# Patient Record
Sex: Female | Born: 1975 | Race: White | Hispanic: No | Marital: Single | Smoking: Current every day smoker
Health system: Southern US, Community
[De-identification: ages and names within clinical notes are randomized; demographics above are authoritative.]

---

## 2000-07-27 ENCOUNTER — Other Ambulatory Visit: Admission: RE | Admit: 2000-07-27 | Discharge: 2000-07-27 | Payer: Self-pay | Admitting: Obstetrics

## 2000-11-05 ENCOUNTER — Inpatient Hospital Stay (HOSPITAL_COMMUNITY): Admission: AD | Admit: 2000-11-05 | Discharge: 2000-11-07 | Payer: Self-pay

## 2001-10-28 ENCOUNTER — Inpatient Hospital Stay (HOSPITAL_COMMUNITY): Admission: AD | Admit: 2001-10-28 | Discharge: 2001-10-30 | Payer: Self-pay | Admitting: Obstetrics

## 2001-10-28 ENCOUNTER — Encounter (INDEPENDENT_AMBULATORY_CARE_PROVIDER_SITE_OTHER): Payer: Self-pay

## 2002-03-09 ENCOUNTER — Emergency Department (HOSPITAL_COMMUNITY): Admission: EM | Admit: 2002-03-09 | Discharge: 2002-03-10 | Payer: Self-pay | Admitting: Emergency Medicine

## 2002-07-15 ENCOUNTER — Emergency Department (HOSPITAL_COMMUNITY): Admission: EM | Admit: 2002-07-15 | Discharge: 2002-07-16 | Payer: Self-pay | Admitting: Emergency Medicine

## 2002-07-16 ENCOUNTER — Encounter: Payer: Self-pay | Admitting: Emergency Medicine

## 2002-08-18 ENCOUNTER — Emergency Department (HOSPITAL_COMMUNITY): Admission: EM | Admit: 2002-08-18 | Discharge: 2002-08-18 | Payer: Self-pay | Admitting: Emergency Medicine

## 2003-07-23 ENCOUNTER — Emergency Department (HOSPITAL_COMMUNITY): Admission: EM | Admit: 2003-07-23 | Discharge: 2003-07-24 | Payer: Self-pay | Admitting: Emergency Medicine

## 2003-12-27 ENCOUNTER — Emergency Department (HOSPITAL_COMMUNITY): Admission: EM | Admit: 2003-12-27 | Discharge: 2003-12-27 | Payer: Self-pay | Admitting: Family Medicine

## 2004-10-10 ENCOUNTER — Emergency Department (HOSPITAL_COMMUNITY): Admission: EM | Admit: 2004-10-10 | Discharge: 2004-10-10 | Payer: Self-pay | Admitting: Family Medicine

## 2005-02-03 ENCOUNTER — Emergency Department (HOSPITAL_COMMUNITY): Admission: EM | Admit: 2005-02-03 | Discharge: 2005-02-03 | Payer: Self-pay | Admitting: Family Medicine

## 2006-12-28 ENCOUNTER — Emergency Department (HOSPITAL_COMMUNITY): Admission: EM | Admit: 2006-12-28 | Discharge: 2006-12-28 | Payer: Self-pay | Admitting: Emergency Medicine

## 2008-01-29 IMAGING — CR DG FOOT COMPLETE 3+V*R*
3 series · 3 of 3 positions shown · non-contrast
Comparison: none

CLINICAL DATA: Foot injury.
 RIGHT FOOT ? 3 VIEW:

[view not recorded (1 of 3)]
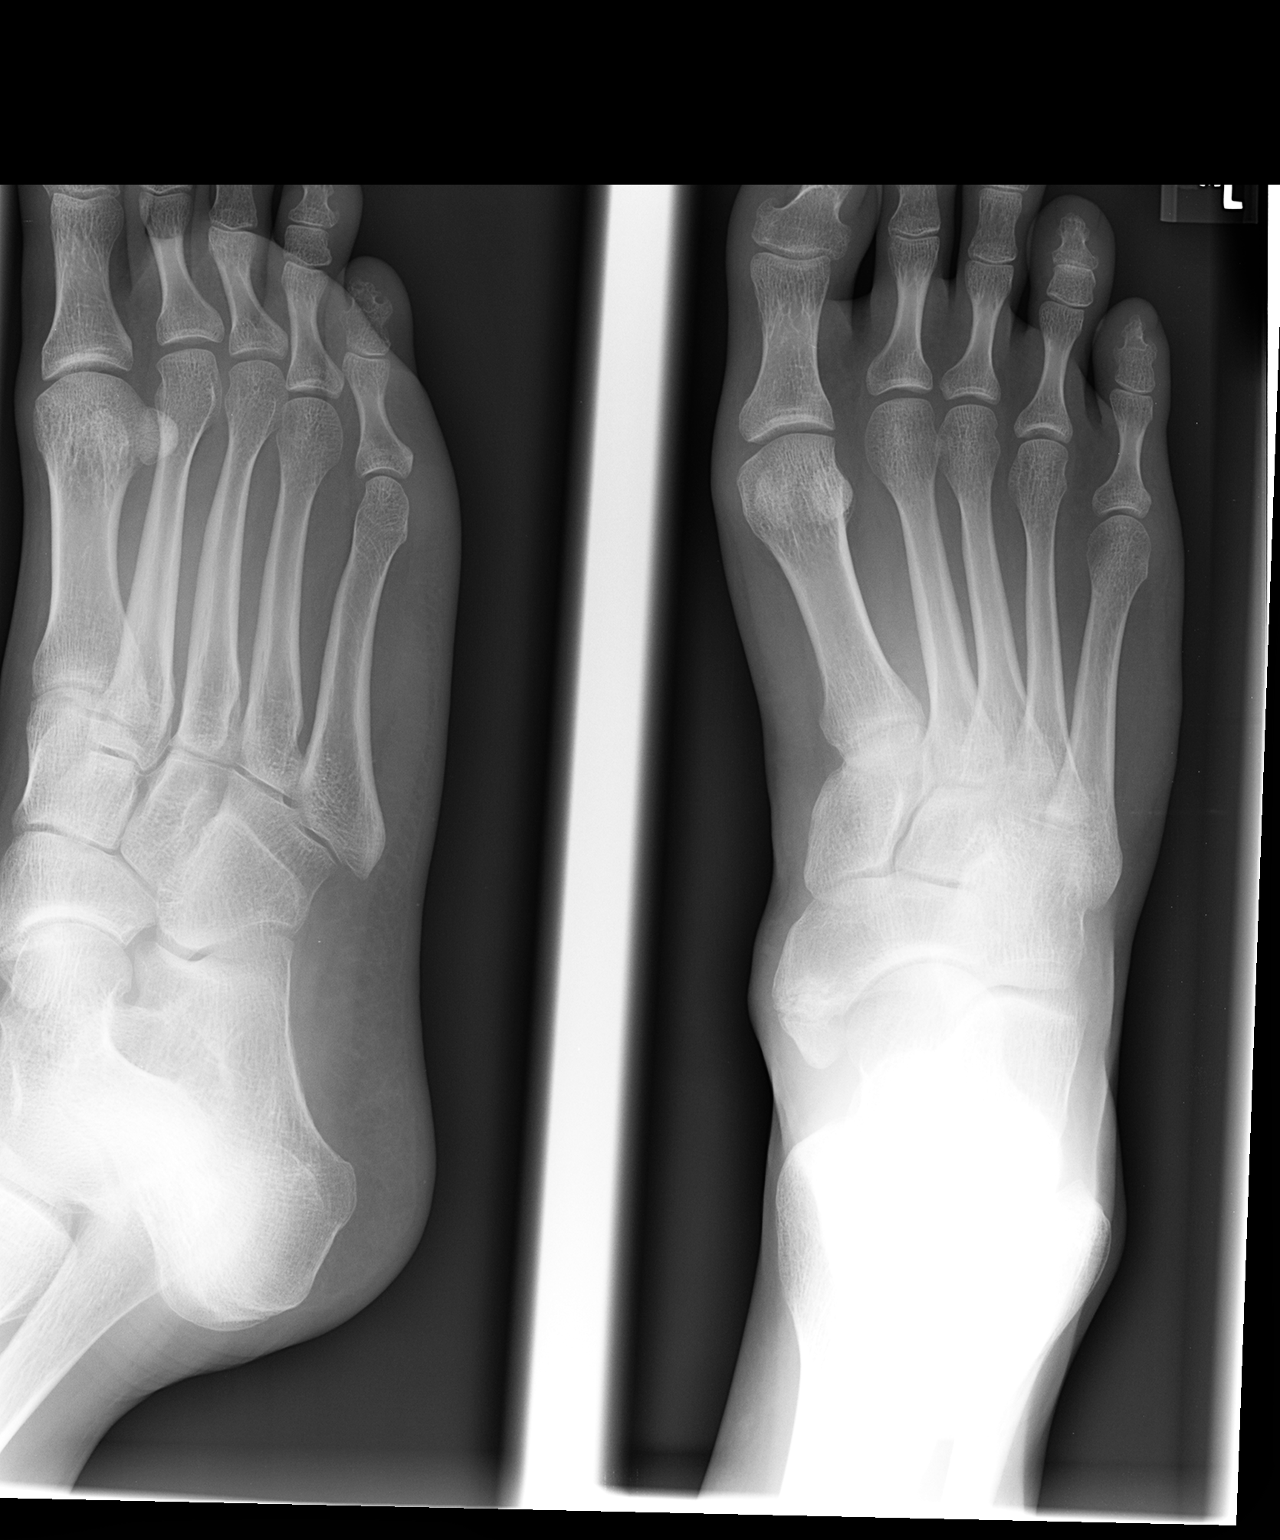

[view not recorded (2 of 3)]
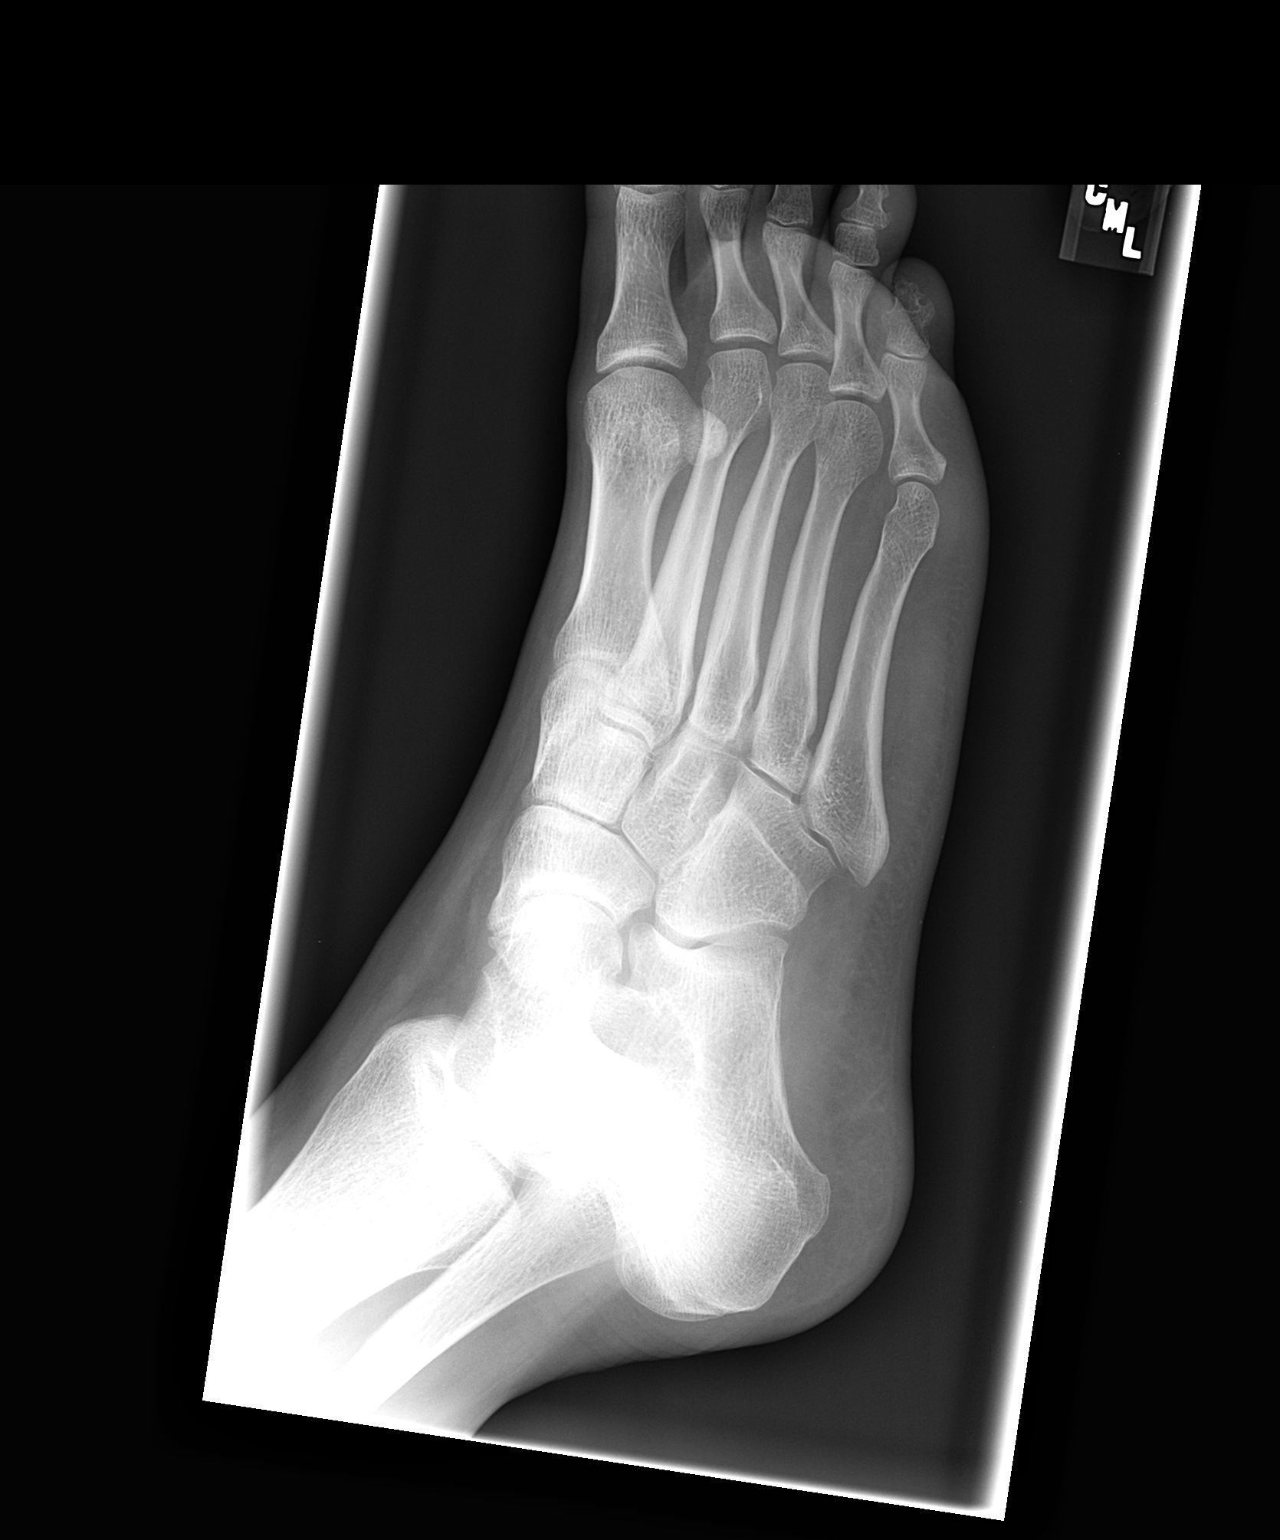

[view not recorded (3 of 3)]
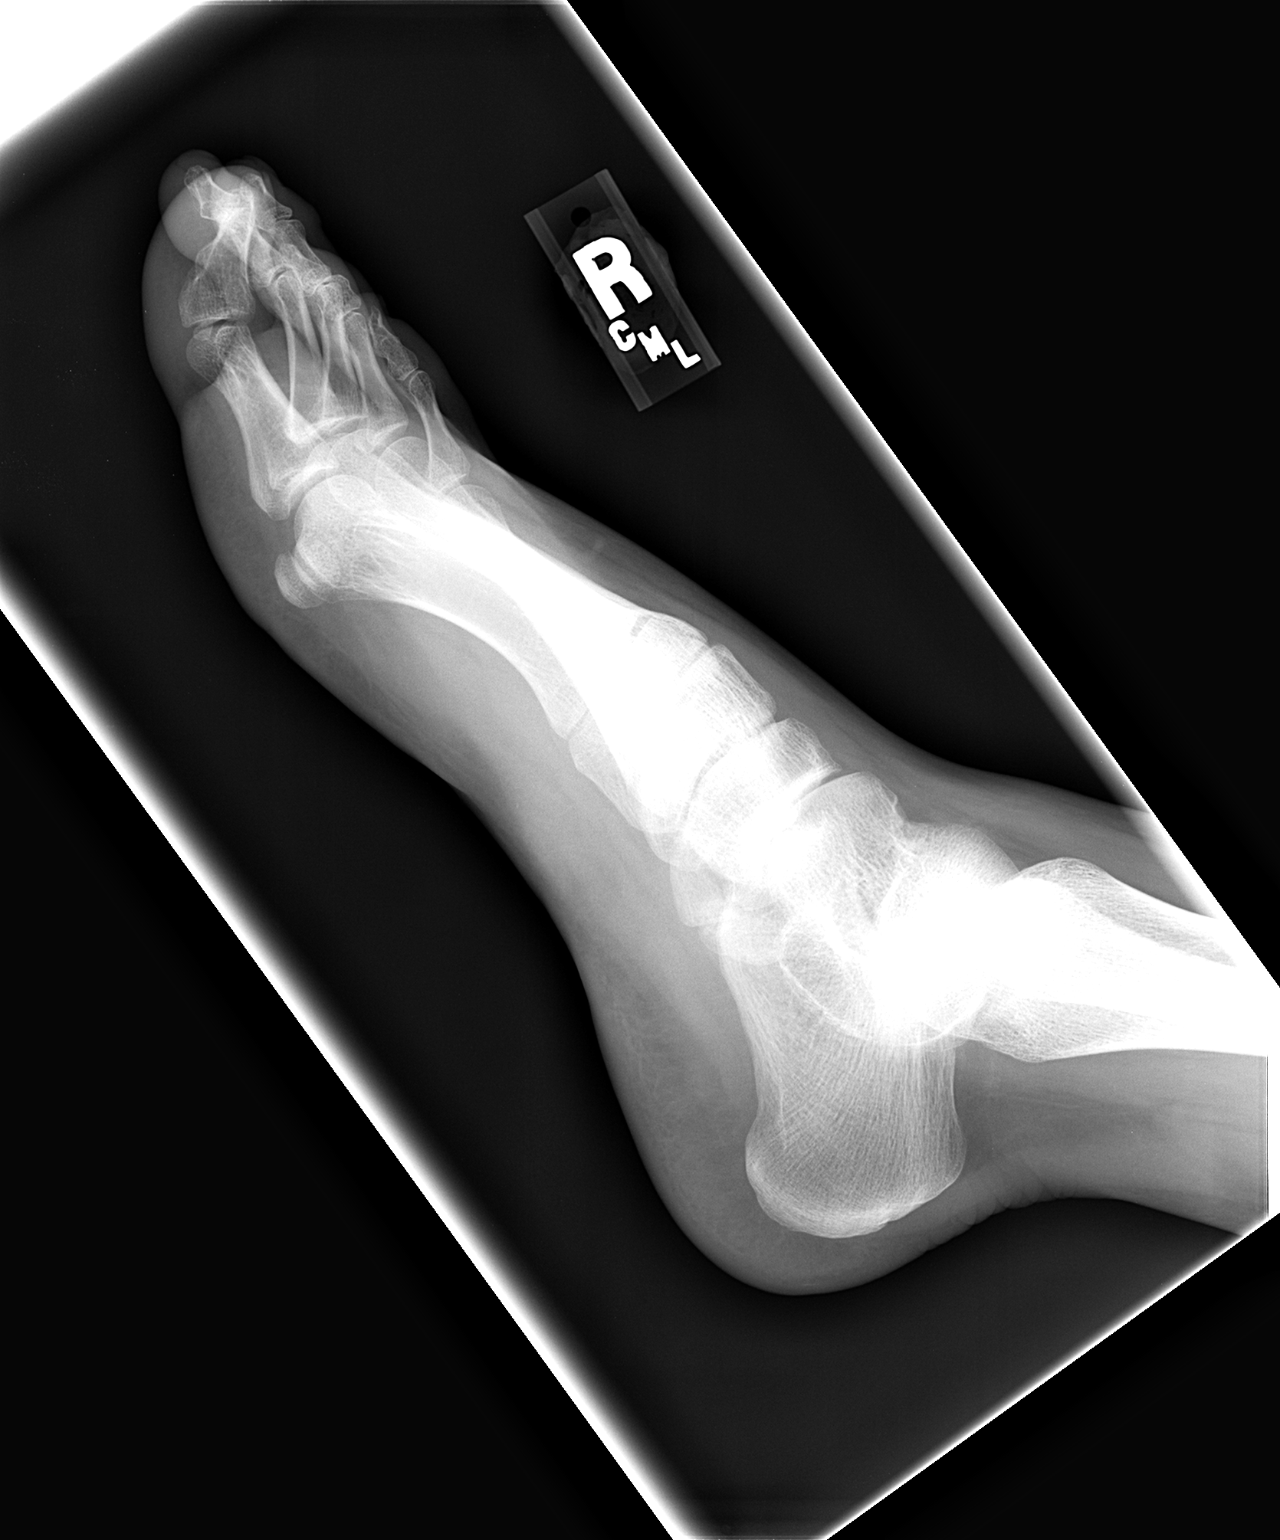

[3 of 3 positions shown; findings below may reference images not displayed]

FINDINGS: There is no evidence of fracture or dislocation.  There is no evidence of arthropathy or other focal bone abnormality.  Soft tissues are unremarkable.
IMPRESSION: Negative.

## 2008-09-14 ENCOUNTER — Emergency Department (HOSPITAL_COMMUNITY): Admission: EM | Admit: 2008-09-14 | Discharge: 2008-09-14 | Payer: Self-pay | Admitting: Family Medicine

## 2009-08-08 ENCOUNTER — Emergency Department (HOSPITAL_COMMUNITY): Admission: EM | Admit: 2009-08-08 | Discharge: 2009-08-09 | Payer: Self-pay | Admitting: Emergency Medicine

## 2010-07-12 ENCOUNTER — Emergency Department (HOSPITAL_COMMUNITY)
Admission: EM | Admit: 2010-07-12 | Discharge: 2010-07-12 | Payer: Self-pay | Source: Home / Self Care | Admitting: Emergency Medicine

## 2010-09-29 LAB — POCT I-STAT, CHEM 8
BUN: 13 mg/dL (ref 6–23)
Calcium, Ion: 1.07 mmol/L — ABNORMAL LOW (ref 1.12–1.32)
Chloride: 108 mEq/L (ref 96–112)
Creatinine, Ser: 0.8 mg/dL (ref 0.4–1.2)
Glucose, Bld: 92 mg/dL (ref 70–99)
HCT: 42 % (ref 36.0–46.0)
Hemoglobin: 14.3 g/dL (ref 12.0–15.0)
Potassium: 3.3 mEq/L — ABNORMAL LOW (ref 3.5–5.1)
Sodium: 138 mEq/L (ref 135–145)
TCO2: 21 mmol/L (ref 0–100)

## 2010-09-29 LAB — RAPID STREP SCREEN (MED CTR MEBANE ONLY): Streptococcus, Group A Screen (Direct): NEGATIVE

## 2010-10-24 LAB — POCT RAPID STREP A (OFFICE): Streptococcus, Group A Screen (Direct): POSITIVE — AB

## 2010-11-29 NOTE — Op Note (Signed)
[facility]  Patient:    Kristen Nolan, Kristen Nolan Visit Number: [id number] MRN: [id number]          Service Type: OBS Location: 910A [id number] Attending Physician:  Venita Sheffield Dictated by:   Kathreen Cosier, M.D. Proc. Date: 10/29/01 Admit Date:  10/28/2001 Discharge Date: 10/30/2001                             Operative Report  PREOPERATIVE DIAGNOSIS:       Multiparity.  PROCEDURE:                    Postpartum tubal ligation.  SURGEON:                      Kathreen Cosier, M.D.  DESCRIPTION OF PROCEDURE:     Using epidural with the patient in the supine position, the abdomen was prepped and draped.  The bladder was emptied with the Foley catheter.  A midline subumbilical incision one inch long was carried down to the rectus fascia.  The fascia was cleaned, grasped with two Kochers and the fascia in the peritoneum opened with the Mayo scissors.  The left tube was grasped in the midportion with a Babcock clamp, and the tube traced to the fimbria.  Sutures of #0 plain were placed in the mesosalpinx below the portion of the tube that had been clamped, and this was tied, and approximately one inch of tube transected.  Hemostasis was satisfactory.  I proceeded in a similar fashion on the other side.  The abdomen was closed in layers.  The peritoneum and the fascia closed with continuous suture of #0 Dexon, and the skin closed with subcuticular stitch of #3-0 plain.  The estimated blood loss was 5 cc.  The patient tolerated the procedure well and was taken to the recovery room in good condition. Dictated by:   Kathreen Cosier, M.D. Attending Physician:  Venita Sheffield DD:  10/29/01 TD:  10/30/01 Job: [id number]

## 2011-08-13 IMAGING — CR DG TOE 3RD 2+V*L*
3 series · 3 of 3 positions shown · non-contrast
Comparison: None

CLINICAL DATA: Toe injury

LEFT TOE - 2+ VIEW

[t toes ap left]
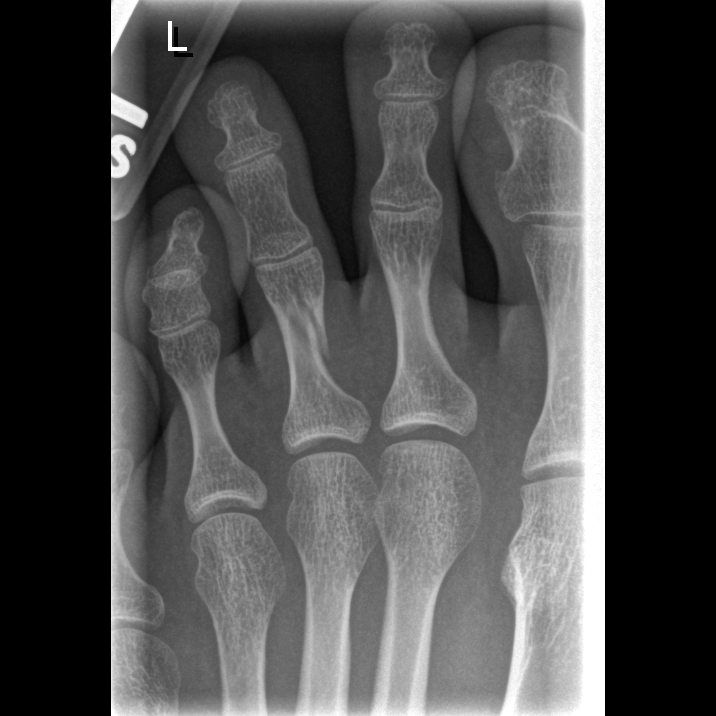

[t toes oblique left]
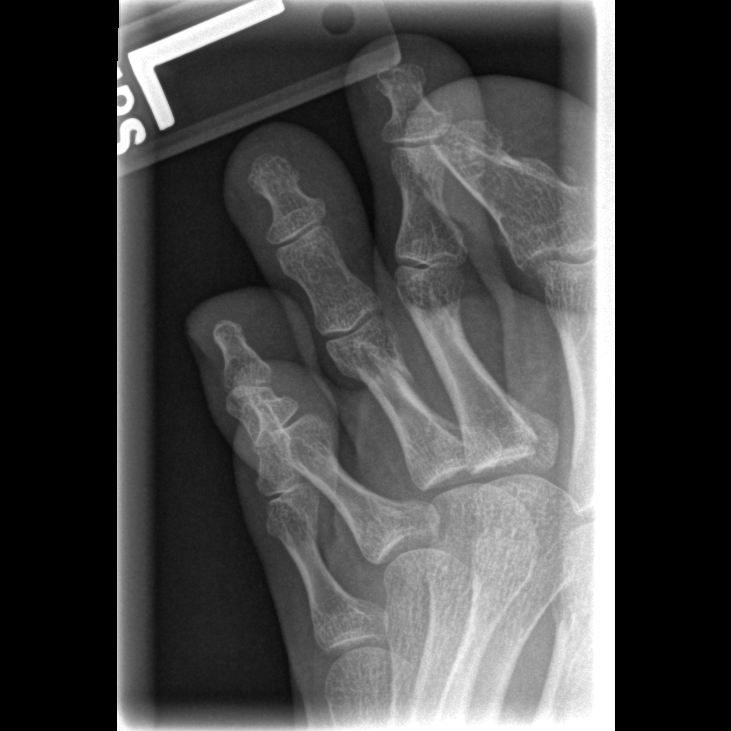

[t toes lateral left]
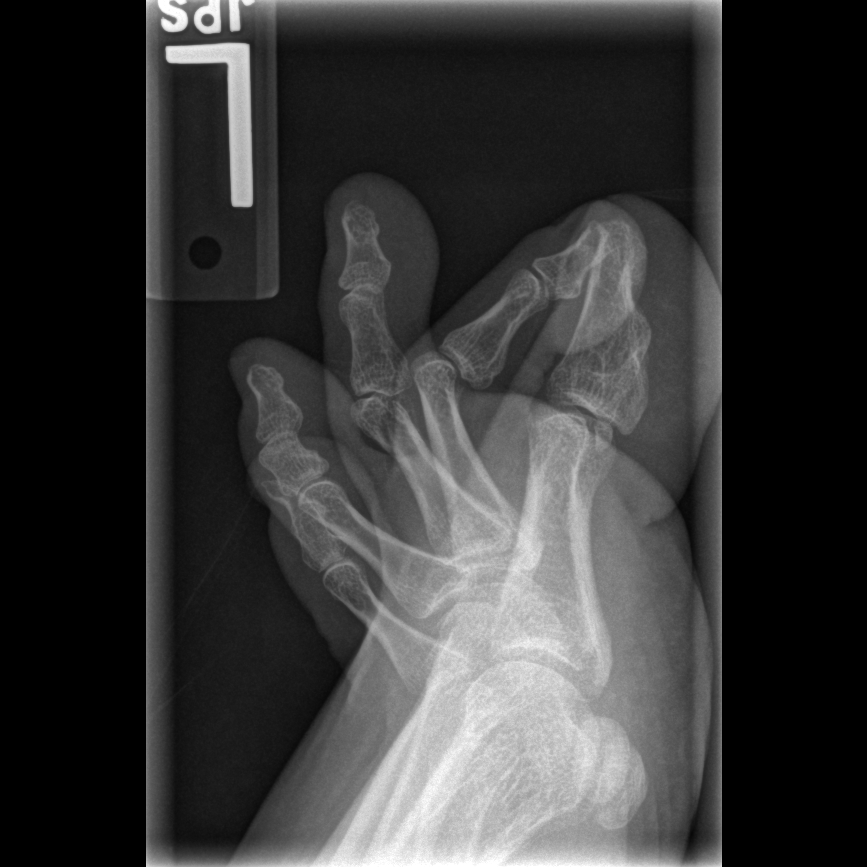

[3 of 3 positions shown; findings below may reference images not displayed]

FINDINGS: Fracture of the proximal third phalanx with mild
displacement.  The fracture may extend into the PIP joint.  No
other fracture.
IMPRESSION: Fracture of the proximal third phalanx which may extend into the
PIP joint.

## 2013-10-01 ENCOUNTER — Encounter (HOSPITAL_COMMUNITY): Payer: Self-pay | Admitting: Emergency Medicine

## 2013-10-01 ENCOUNTER — Emergency Department (HOSPITAL_COMMUNITY)
Admission: EM | Admit: 2013-10-01 | Discharge: 2013-10-01 | Disposition: A | Discharge status: Home / Self Care | Payer: Medicaid Other | Source: Home / Self Care | Attending: Family Medicine | Admitting: Family Medicine

## 2013-10-01 DIAGNOSIS — J329 Chronic sinusitis, unspecified: Secondary | ICD-10-CM

## 2013-10-01 MED ORDER — PREDNISONE 10 MG PO TABS: 30.0000 mg | ORAL_TABLET | Freq: Every day | ORAL | Status: DC

## 2013-10-01 MED ORDER — FLUCONAZOLE 150 MG PO TABS: 150.0000 mg | ORAL_TABLET | Freq: Once | ORAL | Status: DC

## 2013-10-01 MED ORDER — TRAMADOL HCL 50 MG PO TABS: 50.0000 mg | ORAL_TABLET | Freq: Every evening | ORAL | Status: DC | PRN

## 2013-10-01 MED ORDER — IPRATROPIUM BROMIDE 0.06 % NA SOLN: 2.0000 | Freq: Four times a day (QID) | NASAL | Status: DC

## 2013-10-01 MED ORDER — AMOXICILLIN-POT CLAVULANATE 875-125 MG PO TABS: 1.0000 | ORAL_TABLET | Freq: Two times a day (BID) | ORAL | Status: DC

## 2013-10-01 NOTE — ED Notes (Signed)
PT  REPORTS   SYMPTOMS  OF  SINUS      CONGESTION  DRAINAGE        WITH  COUGH  AND  HEADACHE   X  1  WEEK        PT  STES  HAS  HAD  SYMPTOMS  LIKE  THIS  BEFORE  AND  REQUESTS  AUGMENTIN AND  A  YEAST  PILL

## 2013-10-01 NOTE — Discharge Instructions (Signed)
Thank you for coming in today. Take medications as directed Call or go to the emergency room if you get worse, have trouble breathing, have chest pains, or palpitations.   Sinusitis Sinusitis is redness, soreness, and swelling (inflammation) of the paranasal sinuses. Paranasal sinuses are air pockets within the bones of your face (beneath the eyes, the middle of the forehead, or above the eyes). In healthy paranasal sinuses, mucus is able to drain out, and air is able to circulate through them by way of your nose. However, when your paranasal sinuses are inflamed, mucus and air can become trapped. This can allow bacteria and other germs to grow and cause infection. Sinusitis can develop quickly and last only a short time (acute) or continue over a long period (chronic). Sinusitis that lasts for more than 12 weeks is considered chronic.  CAUSES  Causes of sinusitis include:  Allergies.  Structural abnormalities, such as displacement of the cartilage that separates your nostrils (deviated septum), which can decrease the air flow through your nose and sinuses and affect sinus drainage.  Functional abnormalities, such as when the small hairs (cilia) that line your sinuses and help remove mucus do not work properly or are not present. SYMPTOMS  Symptoms of acute and chronic sinusitis are the same. The primary symptoms are pain and pressure around the affected sinuses. Other symptoms include:  Upper toothache.  Earache.  Headache.  Bad breath.  Decreased sense of smell and taste.  A cough, which worsens when you are lying flat.  Fatigue.  Fever.  Thick drainage from your nose, which often is green and may contain pus (purulent).  Swelling and warmth over the affected sinuses. DIAGNOSIS  Your caregiver will perform a physical exam. During the exam, your caregiver may:  Look in your nose for signs of abnormal growths in your nostrils (nasal polyps).  Tap over the affected sinus to  check for signs of infection.  View the inside of your sinuses (endoscopy) with a special imaging device with a light attached (endoscope), which is inserted into your sinuses. If your caregiver suspects that you have chronic sinusitis, one or more of the following tests may be recommended:  Allergy tests.  Nasal culture A sample of mucus is taken from your nose and sent to a lab and screened for bacteria.  Nasal cytology A sample of mucus is taken from your nose and examined by your caregiver to determine if your sinusitis is related to an allergy. TREATMENT  Most cases of acute sinusitis are related to a viral infection and will resolve on their own within 10 days. Sometimes medicines are prescribed to help relieve symptoms (pain medicine, decongestants, nasal steroid sprays, or saline sprays).  However, for sinusitis related to a bacterial infection, your caregiver will prescribe antibiotic medicines. These are medicines that will help kill the bacteria causing the infection.  Rarely, sinusitis is caused by a fungal infection. In theses cases, your caregiver will prescribe antifungal medicine. For some cases of chronic sinusitis, surgery is needed. Generally, these are cases in which sinusitis recurs more than 3 times per year, despite other treatments. HOME CARE INSTRUCTIONS   Drink plenty of water. Water helps thin the mucus so your sinuses can drain more easily.  Use a humidifier.  Inhale steam 3 to 4 times a day (for example, sit in the bathroom with the shower running).  Apply a warm, moist washcloth to your face 3 to 4 times a day, or as directed by your caregiver.  Use saline nasal sprays to help moisten and clean your sinuses.  Take over-the-counter or prescription medicines for pain, discomfort, or fever only as directed by your caregiver. SEEK IMMEDIATE MEDICAL CARE IF:  You have increasing pain or severe headaches.  You have nausea, vomiting, or drowsiness.  You have  swelling around your face.  You have vision problems.  You have a stiff neck.  You have difficulty breathing. MAKE SURE YOU:   Understand these instructions.  Will watch your condition.  Will get help right away if you are not doing well or get worse. Document Released: 06/30/2005 Document Revised: 09/22/2011 Document Reviewed: 07/15/2011 [facility] Patient Information 2014 [employer], [place].

## 2013-10-01 NOTE — ED Provider Notes (Signed)
Kristen ChurchesMelissa J Nolan is a 38 y.o. female who presents to Urgent Care today for sinus pain pressure nasal discharge chest congestion and cough. Patient initially had cold symptoms for one week was improving and then worsened again recently. Symptoms are consistent with prior episodes of bacterial sinusitis. No fevers or chills nausea vomiting or diarrhea. Right facial pain present. Patient feels well otherwise. No shortness of chest pain or palpitations. No sneezing or itchy watery eyes. Patient has tried over-the-counter medications including antihistamines which have not been helpful.   History reviewed. No pertinent past medical history. History  Substance Use Topics  . Smoking status: Never Smoker   . Smokeless tobacco: Not on file  . Alcohol Use: No   ROS as above Medications: No current facility-administered medications for this encounter.   Current Outpatient Prescriptions  Medication Sig Dispense Refill  . amoxicillin-clavulanate (AUGMENTIN) 875-125 MG per tablet Take 1 tablet by mouth every 12 (twelve) hours.  14 tablet  0  . ipratropium (ATROVENT) 0.06 % nasal spray Place 2 sprays into both nostrils 4 (four) times daily.  15 mL  1  . predniSONE (DELTASONE) 10 MG tablet Take 3 tablets (30 mg total) by mouth daily.  15 tablet  0  . traMADol (ULTRAM) 50 MG tablet Take 1 tablet (50 mg total) by mouth at bedtime as needed (cough).  10 tablet  0    Exam:  BP 109/66  Pulse 78  Temp(Src) 98.3 F (36.8 C) (Oral)  Resp 18  SpO2 99%  LMP 10/01/2013 Gen: Well NAD HEENT: EOMI,  MMM tender palpation right maxillary sinus. Her nasal discharge and inflamed nasal turbinates bilaterally. Tympanic membranes normal bilaterally. Posterior pharynx with cobblestoning. Lungs: Normal work of breathing. CTABL Heart: RRR no MRG Abd: NABS, Soft. NT, ND Exts: Brisk capillary refill, warm and well perfused.     Assessment and Plan: 38 y.o. female with sinusitis. Plan to treat with Augmentin,  prednisone, Atrovent nasal spray, and tramadol for cough.  Discussed warning signs or symptoms. Please see discharge instructions. Patient expresses understanding.    Rodolph BongEvan S Statia Burdick, MD 10/01/13 [phone]

## 2017-10-05 ENCOUNTER — Ambulatory Visit (HOSPITAL_COMMUNITY)
Admission: EM | Admit: 2017-10-05 | Discharge: 2017-10-05 | Disposition: A | Discharge status: Home / Self Care | Payer: Self-pay | Attending: Family Medicine | Admitting: Family Medicine

## 2017-10-05 ENCOUNTER — Encounter (HOSPITAL_COMMUNITY): Payer: Self-pay | Admitting: Emergency Medicine

## 2017-10-05 DIAGNOSIS — L600 Ingrowing nail: Secondary | ICD-10-CM

## 2017-10-05 DIAGNOSIS — M79674 Pain in right toe(s): Secondary | ICD-10-CM

## 2017-10-05 MED ORDER — SULFAMETHOXAZOLE-TRIMETHOPRIM 800-160 MG PO TABS: 1.0000 | ORAL_TABLET | Freq: Two times a day (BID) | ORAL | 0 refills | Status: AC

## 2017-10-05 NOTE — ED Provider Notes (Signed)
[facility]    CSN: [id number] Arrival date & time: 10/05/17  1322     History   Chief Complaint Chief Complaint  Patient presents with  . Toe Pain    HPI Kristen Nolan is a 42 y.o. female.   Kristen Nolan presents with complaints of right great toenail pain and swelling. States the nail has had altered growth after stubbing the toe for some time. However starting three days ago developed increased pain and swelling with redness. Has not tried any treatments or taken any medications for symptoms. Without pus or drainage. Without contributing medical history.    ROS per HPI.      History reviewed. No pertinent past medical history.  There are no active problems to display for this patient.   History reviewed. No pertinent surgical history.  OB History   None      Home Medications    Prior to Admission medications   Medication Sig Start Date End Date Taking? Authorizing Provider  sulfamethoxazole-trimethoprim (BACTRIM DS,SEPTRA DS) 800-160 MG tablet Take 1 tablet by mouth 2 (two) times daily for 7 days. 10/05/17 10/12/17  Georgetta Haber, NP    Family History History reviewed. No pertinent family history.  Social History Social History   Tobacco Use  . Smoking status: Never Smoker  Substance Use Topics  . Alcohol use: No  . Drug use: Not on file     Allergies   Patient has no known allergies.   Review of Systems Review of Systems   Physical Exam Triage Vital Signs ED Triage Vitals [10/05/17 1339]  Enc Vitals Group     BP 102/72     Pulse Rate 78     Resp 18     Temp 98.7 F (37.1 C)     Temp Source Oral     SpO2 98 %     Weight      Height      Head Circumference      Peak Flow      Pain Score      Pain Loc      Pain Edu?      Excl. in GC?    No data found.  Updated Vital Signs BP 102/72 (BP Location: Left Arm)   Pulse 78   Temp 98.7 F (37.1 C) (Oral)   Resp 18   SpO2 98%   Visual Acuity Right Eye Distance:     Left Eye Distance:   Bilateral Distance:    Right Eye Near:   Left Eye Near:    Bilateral Near:     Physical Exam  Constitutional: She is oriented to person, place, and time. She appears well-developed and well-nourished. No distress.  Cardiovascular: Normal rate, regular rhythm and normal heart sounds.  Pulmonary/Chest: Effort normal and breath sounds normal.  Musculoskeletal:       Right foot: There is tenderness.  Right great toenail with altered growth and deformity present; growing into distal end of soft tissue rather than lateral toe; mild redness; without active drainage; nail appears to be partially separated from bed   Neurological: She is alert and oriented to person, place, and time.  Skin: Skin is warm and dry.     UC Treatments / Results  Labs (all labs ordered are listed, but only abnormal results are displayed) Labs Reviewed - No data to display  EKG None Radiology No results found.  Procedures Procedures (including critical care time)  Medications Ordered in UC Medications -  No data to display   Initial Impression / Assessment and Plan / UC Course  I have reviewed the triage vital signs and the nursing notes.  Pertinent labs & imaging results that were available during my care of the patient were reviewed by me and considered in my medical decision making (see chart for details).     Significant deformity to nail noted with redness and tenderness. Recommended podiatry for nail removal due to deformity presence. Bactrim initiated and warm soaks. Patient verbalized understanding and agreeable to plan.    Final Clinical Impressions(s) / UC Diagnoses   Final diagnoses:  Ingrown nail of great toe of right foot    ED Discharge Orders        Ordered    sulfamethoxazole-trimethoprim (BACTRIM DS,SEPTRA DS) 800-160 MG tablet  2 times daily     10/05/17 1445       Controlled Substance Prescriptions [place] Controlled Substance Registry consulted? Not  Applicable   Georgetta HaberBurky, Natalie B, NP 10/05/17 1458

## 2017-10-05 NOTE — ED Triage Notes (Signed)
Pt here with right great toe pain from possible ingrown toenail

## 2017-10-05 NOTE — Discharge Instructions (Signed)
Antibiotics.  Warm soaks of foot 2-3 times a day. Please make appointment with Podiatry for definitive nail treatment.

## 2017-10-13 ENCOUNTER — Ambulatory Visit: Payer: Medicaid Other | Admitting: Podiatry

## 2017-10-13 ENCOUNTER — Emergency Department (HOSPITAL_COMMUNITY)
Admission: EM | Admit: 2017-10-13 | Discharge: 2017-10-13 | Disposition: A | Discharge status: Home / Self Care | Payer: Medicaid Other | Attending: Emergency Medicine | Admitting: Emergency Medicine

## 2017-10-13 ENCOUNTER — Encounter (HOSPITAL_COMMUNITY): Payer: Self-pay

## 2017-10-13 DIAGNOSIS — L600 Ingrowing nail: Secondary | ICD-10-CM | POA: Insufficient documentation

## 2017-10-13 DIAGNOSIS — L608 Other nail disorders: Secondary | ICD-10-CM | POA: Insufficient documentation

## 2017-10-13 MED ORDER — LIDOCAINE HCL (PF) 1 % IJ SOLN
30.0000 mL | Freq: Once | INTRAMUSCULAR | Status: AC
Start: 2017-10-13 — End: 2017-10-13
  Administered 2017-10-13: 30 mL
  Filled 2017-10-13: qty 30

## 2017-10-13 MED ORDER — TETANUS-DIPHTH-ACELL PERTUSSIS 5-2.5-18.5 LF-MCG/0.5 IM SUSP
0.5000 mL | Freq: Once | INTRAMUSCULAR | Status: AC
Start: 2017-10-13 — End: 2017-10-13
  Administered 2017-10-13: 0.5 mL via INTRAMUSCULAR
  Filled 2017-10-13: qty 0.5

## 2017-10-13 NOTE — ED Provider Notes (Signed)
[facility]   CSN: [id number] Arrival date & time: 10/13/17  1045     History   Chief Complaint Chief Complaint  Patient presents with  . Toe Pain    HPI Kristen Nolan is a 42 y.o. female.  Kristen Nolan is a 42 y.o. Female who is otherwise healthy, presents to the ED for evaluation of right great toe pain and toenail deformity.  Patient reports she stubbed her toe a year or 2 ago and since then she has had noted deformity of the nail but has not caused her any problems until about a week and a half ago when she started having pain and swelling in the area.  She presented to urgent care 1 week ago for redness, swelling and increasing pain in the right great toe surrounding the nail, was prescribed Bactrim and referred to podiatry.  Patient reports vast improvement in redness, swelling and pain with Bactrim but she is still having pain at the distal end of the toe where the nail seems to be growing into the skin.  Urgent care was not able to remove the nail and refer the patient to podiatry but she reports they wanted $200 before they would even see the patient she is unable to afford this at the time, does not have a primary care provider.  Patient reports she is also been soaking the toe twice a day in Epsom salts.     History reviewed. No pertinent past medical history.  There are no active problems to display for this patient.   History reviewed. No pertinent surgical history.   OB History   None      Home Medications    Prior to Admission medications   Not on File    Family History No family history on file.  Social History Social History   Tobacco Use  . Smoking status: Never Smoker  Substance Use Topics  . Alcohol use: No  . Drug use: Not on file     Allergies   Patient has no known allergies.   Review of Systems Review of Systems  Constitutional: Negative for chills and fever.    Musculoskeletal: Negative for arthralgias, gait problem and joint swelling.  Skin: Negative for color change and rash.       Deformity of right great toenail  Neurological: Negative for weakness and numbness.     Physical Exam Updated Vital Signs BP 112/72 (BP Location: Right Arm)   Pulse 84   Temp 98.1 F (36.7 C) (Oral)   Resp 18   SpO2 98%   Physical Exam  Constitutional: She appears well-developed and well-nourished. No distress.  HENT:  Head: Normocephalic and atraumatic.  Eyes: Right eye exhibits no discharge. Left eye exhibits no discharge.  Pulmonary/Chest: Effort normal. No respiratory distress.  Musculoskeletal:  Right great toenail with altered growth and deformity, nail appears to be growing into the distal end of the soft tissue, and does appear somewhat ingrown on the lateral aspect as well, minimal erythema, tenderness to palpation only at the distal aspect warts growing into the soft tissue, no swelling or drainage present no evident abscess or paronychia, nail appears to be partially separated from the bed  Neurological: She is alert. Coordination normal.  Skin: Skin is warm and dry. She is not diaphoretic.  Psychiatric: She has a normal mood and affect. Her behavior is normal.  Nursing note and vitals reviewed.    ED Treatments /  Results  Labs (all labs ordered are listed, but only abnormal results are displayed) Labs Reviewed - No data to display  EKG None  Radiology No results found.  Procedures .Nail Removal Date/Time: 10/13/2017 11:48 AM Performed by: Dartha Lodge, PA-C Authorized by: Dartha Lodge, PA-C   Consent:    Consent obtained:  Verbal   Consent given by:  Patient   Risks discussed:  Bleeding, incomplete removal, infection and pain   Alternatives discussed:  No treatment Location:    Foot:  R big toe Pre-procedure details:    Skin preparation:  ChloraPrep Anesthesia (see MAR for exact dosages):    Anesthesia method:  Nerve  block   Block location:  R Great toe   Block needle gauge:  25 G   Block anesthetic:  Lidocaine 1% w/o epi   Block technique:  H technique   Block injection procedure:  Incremental injection, introduced needle and anatomic landmarks identified   Block outcome:  Anesthesia achieved Nail Removal:    Nail removed:  Partial   Nail bed repaired: no     Removed nail replaced and anchored: no   Trephination:    Subungual hematoma drained: no   Ingrown nail:    Nail matrix removed or ablated:  None Nails trimmed:    Number of nails trimmed:  1 Post-procedure details:    Dressing:  Antibiotic ointment and 4x4 sterile gauze   Patient tolerance of procedure:  Tolerated well, no immediate complications Comments:     Toenail growing into the distal end of the soft tissue rather than the lateral toenail, toenail itself is extremely thickened, unable to cut or remove the entire nail due to thickness but was able to remove the distal portion that is growing into the soft tissue   (including critical care time)  Medications Ordered in ED Medications  Tdap (BOOSTRIX) injection 0.5 mL (0.5 mLs Intramuscular Given 10/13/17 1119)  lidocaine (PF) (XYLOCAINE) 1 % injection 30 mL (30 mLs Infiltration Given 10/13/17 1120)     Initial Impression / Assessment and Plan / ED Course  I have reviewed the triage vital signs and the nursing notes.  Pertinent labs & imaging results that were available during my care of the patient were reviewed by me and considered in my medical decision making (see chart for details).  Patient presents to the ED for evaluation of deformity of the right great toenail with associated pain, she was seen at urgent care 1 week ago, at that time nail.  Erythematous swollen and tender she was treated with a course of Bactrim and has been soaking the nail in Epsom salts has had improvement with redness, swelling and pain but still has discomfort where the distal end of the nail appears to  be growing into the skin.  Patient referred to podiatry but was unable to afford it at this time.  Nail is very thickened will attempt to remove portion of the nail to relieve pain.  Good anesthesia achieved with digital block of the right great toe.,  Unable to remove entire nail or cut through nail due to thickness, but was able to remove the very distal portion of the nail where it is growing into the skin, patient reports improvement in pain after this.  Bacitracin placed over the distal soft tissue of the nail and gauze and Coban dressing applied.  No erythema or abscess apparent in surrounding skin.  Will have patient complete course of Bactrim and continue with warm soaks.  Discussed with patient that she will still need to follow-up with podiatry for complete nail removal because we just do not have the necessary tools here in the emergency department.  Provided patient with contact information for multiple podiatry offices as well as the [facility].  Discussed signs of infection that would warrant sooner return.  She expresses understanding and is in agreement with plan.  Final Clinical Impressions(s) / ED Diagnoses   Final diagnoses:  Ingrown right big toenail  Nail deformity    ED Discharge Orders    None       Dartha Lodge, [place] 10/13/17 1243    Rolan Bucco, MD 10/13/17 1414

## 2017-10-13 NOTE — Discharge Instructions (Addendum)
Finish course of antibiotics from last week. Use bacitracin ointment and keep toe clean and dry, continue with epsom salt soaks. Ibuprofen and tylenol for pain, although I suspect this will improve now that ingrown portion has been removed. You will still need to follow up with podiatry for complete removal. You can also follow [facility] with [facility] to establish primary care.   Return to emergency department if you have return of signs of infection such as significantly worsening pain, redness, swelling, warmth or drainage of the toe.

## 2017-10-13 NOTE — ED Triage Notes (Signed)
Pt presents for evaluation of R great toe pain. [facility] but [facility] didn't do anything. Pt reports pain with any pressure, unable to wear normal shoes and it is affecting her work.

## 2022-02-27 ENCOUNTER — Ambulatory Visit
Admission: EM | Admit: 2022-02-27 | Discharge: 2022-02-27 | Disposition: A | Discharge status: Home / Self Care | Payer: Managed Care, Other (non HMO) | Attending: Emergency Medicine | Admitting: Emergency Medicine

## 2022-02-27 DIAGNOSIS — B9689 Other specified bacterial agents as the cause of diseases classified elsewhere: Secondary | ICD-10-CM

## 2022-02-27 DIAGNOSIS — J209 Acute bronchitis, unspecified: Secondary | ICD-10-CM

## 2022-02-27 DIAGNOSIS — H66001 Acute suppurative otitis media without spontaneous rupture of ear drum, right ear: Secondary | ICD-10-CM

## 2022-02-27 DIAGNOSIS — F17210 Nicotine dependence, cigarettes, uncomplicated: Secondary | ICD-10-CM

## 2022-02-27 MED ORDER — ALBUTEROL SULFATE HFA 108 (90 BASE) MCG/ACT IN AERS: 2.0000 | INHALATION_SPRAY | Freq: Four times a day (QID) | RESPIRATORY_TRACT | 0 refills | Status: DC | PRN

## 2022-02-27 MED ORDER — PROMETHAZINE-DM 6.25-15 MG/5ML PO SYRP: 5.0000 mL | ORAL_SOLUTION | Freq: Four times a day (QID) | ORAL | 0 refills | Status: DC | PRN

## 2022-02-27 MED ORDER — METHYLPREDNISOLONE 4 MG PO TBPK: ORAL_TABLET | ORAL | 0 refills | Status: DC

## 2022-02-27 MED ORDER — FLUCONAZOLE 150 MG PO TABS: ORAL_TABLET | ORAL | 0 refills | Status: DC

## 2022-02-27 MED ORDER — CEFDINIR 300 MG PO CAPS: 300.0000 mg | ORAL_CAPSULE | Freq: Two times a day (BID) | ORAL | 0 refills | Status: AC

## 2022-02-27 MED ORDER — CETIRIZINE HCL 10 MG PO TABS: 10.0000 mg | ORAL_TABLET | Freq: Every day | ORAL | 1 refills | Status: DC

## 2022-02-27 MED ORDER — IPRATROPIUM BROMIDE 0.06 % NA SOLN: 2.0000 | Freq: Three times a day (TID) | NASAL | 1 refills | Status: DC

## 2022-02-27 NOTE — Discharge Instructions (Addendum)
Your symptoms and physical exam findings are concerning for a viral respiratory infection.  Because you are respiratory allergies are not well controlled at this time, you are more susceptible to catching respiratory infections.  Smoking cigarettes also increases your risk for frequent respiratory infections.  Please see the list below for recommended medications, dosages and frequencies to provide relief of your current symptoms:    Omnicef (cefdinir):  1 capsule twice daily for 10 days, you can take it with or without food.  This antibiotic can cause upset stomach, this will resolve once antibiotics are complete.  You are welcome to use a probiotic, eat yogurt, take Imodium while taking this medication.  Please avoid other systemic medications such as Maalox, Pepto-Bismol or milk of magnesia as they can interfere with your body's ability to absorb the antibiotics.  It is very important that you take antibiotics as prescribed.  If you skip doses or do not complete the full course of antibiotics, you put yourself at significant risk of recurrent infection which can often be worse than your initial infection.   Diflucan (fluconazole): As you may or may not be aware, taking antibiotics can often cause patients to develop a vaginal yeast infection.  For this reason, I have provided you with a prescription for Diflucan.  Please take the first Diflucan tablet on day 3 or 4 of your antibiotic therapy, and take the second Diflucan tablet 3 days later.  You do not need to pick up this prescription or take this medication unless you develop symptoms of vaginal yeast infection including thick, white vaginal discharge and/or vaginal itching.  This prescription has been provided as a Research officer, political party and for your convenience.  Medrol Dosepak (methylprednisolone): This is a steroid that will significantly calm your upper and lower airways, please take one row of tablets daily with your breakfast meal starting tomorrow morning  until the prescription is complete.      Zyrtec (cetirizine): This is an excellent second-generation antihistamine that helps to reduce respiratory inflammatory response to environmental allergens.  In some patients, this medication can cause daytime sleepiness so I recommend that you take 1 tablet daily at bedtime.     Atrovent (ipratropium): This is an excellent nasal decongestant spray that does not cause rebound congestion, please instill 2 sprays into each nare with each use.  Please use the spray up to 4 times daily as needed.  I have provided you with a prescription for this medication.      ProAir, Ventolin, Proventil (albuterol): This inhaled medication contains a short acting beta agonist bronchodilator.  This medication works on the smooth muscle that opens and constricts of your airways by relaxing the muscle.  The result of relaxation of the smooth muscle is increased air movement and improved work of breathing.  This is a short acting medication that can be used every 4-6 hours as needed for increased work of breathing, shortness of breath, wheezing and excessive coughing.  I have provided you with a prescription.    Promethazine DM: Promethazine is both a nasal decongestant and an antinausea medication that makes most patients feel fairly sleepy.  The DM is dextromethorphan, a cough suppressant found in many over-the-counter cough medications.  Please take 5 mL before bedtime to minimize your cough which will help you sleep better.  I have sent a prescription for this medication to your pharmacy.   Please follow-up within the next 5-7 days either with your primary care provider or urgent care if your symptoms  do not resolve.  If you do not have a primary care provider, we will assist you in finding one.   Thank you for visiting urgent care today.  We appreciate the opportunity to participate in your care.

## 2022-02-27 NOTE — ED Triage Notes (Signed)
The pt c/o cough, headache, and congestion that began yesterday.   Home interventions: Nyquil

## 2022-02-27 NOTE — ED Provider Notes (Signed)
[facility]    CSN: [id number] Arrival date & time: 02/27/22  1059    HISTORY   Chief Complaint  Patient presents with   Cough   Headache   Nasal Congestion   HPI Kristen Nolan is a pleasant, 46 y.o. female who presents to urgent care today. The pt c/o cough, sinus headache, and nasal congestion with rhinorrhea that began yesterday. Home interventions: Nyquil last night, states she was able to sleep but woke up and felt worse than she did yesterday.  Patient states she is a current everyday smoker.  Patient states she does not have a history of allergies or asthma.  Patient states the cough is nonproductive but it feels like it needs to be.  Patient denies otalgia, body aches, chills, known sick contacts, fever, nausea, vomiting, diarrhea.  The history is provided by the patient.   History reviewed. No pertinent past medical history. There are no problems to display for this patient.  History reviewed. No pertinent surgical history. OB History   No obstetric history on file.    Home Medications    Prior to Admission medications   Not on File    Family History History reviewed. No pertinent family history. Social History Social History   Tobacco Use   Smoking status: Some Days    Types: Cigarettes  Vaping Use   Vaping Use: Never used  Substance Use Topics   Alcohol use: No   Drug use: Never   Allergies   Patient has no known allergies.  Review of Systems Review of Systems Pertinent findings revealed after performing a 14 point review of systems has been noted in the history of present illness.  Physical Exam Triage Vital Signs ED Triage Vitals  Enc Vitals Group     BP 05/10/21 0827 (!) 147/82     Pulse Rate 05/10/21 0827 72     Resp 05/10/21 0827 18     Temp 05/10/21 0827 98.3 F (36.8 C)     Temp Source 05/10/21 0827 Oral     SpO2 05/10/21 0827 98 %     Weight --      Height --      Head Circumference --      Peak Flow --       Pain Score 05/10/21 0826 5     Pain Loc --      Pain Edu? --      Excl. in GC? --   No data found.  Updated Vital Signs BP 126/81 (BP Location: Right Arm)   Pulse 72   Temp 98.7 F (37.1 C) (Oral)   Resp 18   LMP 02/22/2022   SpO2 98%   Physical Exam Vitals and nursing note reviewed.  Constitutional:      General: She is not in acute distress.    Appearance: Normal appearance. She is not ill-appearing.  HENT:     Head: Normocephalic and atraumatic.     Salivary Glands: Right salivary gland is not diffusely enlarged or tender. Left salivary gland is not diffusely enlarged or tender.     Right Ear: Hearing and external ear normal. No drainage. No middle ear effusion. There is no impacted cerumen. Tympanic membrane is injected, erythematous and retracted. Tympanic membrane is not bulging.     Left Ear: Hearing, tympanic membrane, ear canal and external ear normal. No drainage.  No middle ear effusion. There is no impacted cerumen. Tympanic membrane is not erythematous or bulging.  Ears:     Comments: Right EAC with erythema    Nose: Mucosal edema, congestion and rhinorrhea present. No nasal deformity or septal deviation. Rhinorrhea is clear.     Right Turbinates: Not enlarged, swollen or pale.     Left Turbinates: Not enlarged, swollen or pale.     Right Sinus: Maxillary sinus tenderness and frontal sinus tenderness present.     Left Sinus: Maxillary sinus tenderness and frontal sinus tenderness present.     Mouth/Throat:     Lips: Pink. No lesions.     Mouth: Mucous membranes are moist. No oral lesions.     Tongue: No lesions.     Palate: No mass.     Pharynx: Oropharynx is clear. Uvula midline. No pharyngeal swelling, oropharyngeal exudate, posterior oropharyngeal erythema or uvula swelling.     Tonsils: No tonsillar exudate. 0 on the right. 0 on the left.  Eyes:     General: Lids are normal.        Right eye: No discharge.        Left eye: No discharge.     Extraocular  Movements: Extraocular movements intact.     Conjunctiva/sclera: Conjunctivae normal.     Right eye: Right conjunctiva is not injected.     Left eye: Left conjunctiva is not injected.  Neck:     Trachea: Trachea and phonation normal.  Cardiovascular:     Rate and Rhythm: Normal rate and regular rhythm.     Pulses: Normal pulses.     Heart sounds: Normal heart sounds. No murmur heard.    No friction rub. No gallop.  Pulmonary:     Effort: Pulmonary effort is normal. No tachypnea, bradypnea, accessory muscle usage, prolonged expiration, respiratory distress or retractions.     Breath sounds: Decreased air movement present. No stridor or transmitted upper airway sounds. Examination of the right-upper field reveals wheezing. Examination of the left-upper field reveals wheezing. Examination of the right-middle field reveals wheezing. Examination of the left-middle field reveals wheezing. Wheezing present. No decreased breath sounds, rhonchi or rales.  Chest:     Chest wall: No tenderness.  Musculoskeletal:        General: Normal range of motion.     Cervical back: Normal range of motion and neck supple. Normal range of motion.  Lymphadenopathy:     Cervical: Cervical adenopathy present.     Right cervical: Superficial cervical adenopathy and posterior cervical adenopathy present.     Left cervical: Superficial cervical adenopathy and posterior cervical adenopathy present.  Skin:    General: Skin is warm and dry.     Findings: No erythema or rash.  Neurological:     General: No focal deficit present.     Mental Status: She is alert and oriented to person, place, and time.  Psychiatric:        Mood and Affect: Mood normal.        Behavior: Behavior normal.     Visual Acuity Right Eye Distance:   Left Eye Distance:   Bilateral Distance:    Right Eye Near:   Left Eye Near:    Bilateral Near:     UC Couse / Diagnostics / Procedures:     Radiology No results  found.  Procedures Procedures (including critical care time) EKG  Pending results:  Labs Reviewed - No data to display  Medications Ordered in UC: Medications - No data to display  UC Diagnoses / Final Clinical Impressions(s)   I have reviewed  the triage vital signs and the nursing notes.  Pertinent labs & imaging results that were available during my care of the patient were reviewed by me and considered in my medical decision making (see chart for details).    Final diagnoses:  Acute suppurative otitis media of right ear without spontaneous rupture of tympanic membrane, recurrence not specified  Acute bacterial sinusitis  Acute bronchitis, unspecified organism  Tobacco dependence due to cigarettes   Patient prescribed Omnicef for acute otitis media likely bacterial sinusitis.  Patient provided with Diflucan for inevitable vaginal yeast infection caused by 10 days of taking antibiotics.  Patient provided with albuterol and Medrol Dosepak for wheezing on exam.  Promethazine DM for nighttime cough.  Patient educated regarding risk of smoking cigarettes and increased respiratory infections.  Patient provided with allergy treatment.  ED Prescriptions     Medication Sig Dispense Auth. Provider   ipratropium (ATROVENT) 0.06 % nasal spray Place 2 sprays into both nostrils 3 (three) times daily. As needed for nasal congestion, runny nose 15 mL Theadora Rama Scales, PA-C   cetirizine (ZYRTEC ALLERGY) 10 MG tablet Take 1 tablet (10 mg total) by mouth at bedtime. 90 tablet Theadora Rama Scales, PA-C   cefdinir (OMNICEF) 300 MG capsule Take 1 capsule (300 mg total) by mouth 2 (two) times daily for 10 days. 20 capsule Theadora Rama Scales, PA-C   albuterol (VENTOLIN HFA) 108 (90 Base) MCG/ACT inhaler Inhale 2 puffs into the lungs every 6 (six) hours as needed for wheezing or shortness of breath (Cough). 18 g Theadora Rama Scales, PA-C   methylPREDNISolone (MEDROL DOSEPAK) 4 MG TBPK  tablet Take 24 mg on day 1, 20 mg on day 2, 16 mg on day 3, 12 mg on day 4, 8 mg on day 5, 4 mg on day 6.  Take all tablets in each row at once, do not spread tablets out throughout the day. 21 tablet Theadora Rama Scales, PA-C   promethazine-dextromethorphan (PROMETHAZINE-DM) 6.25-15 MG/5ML syrup Take 5 mLs by mouth 4 (four) times daily as needed for cough. 118 mL Theadora Rama Scales, PA-C   fluconazole (DIFLUCAN) 150 MG tablet Take 1 tablet on day 4 of antibiotics.  Take second tablet 3 days later. 2 tablet Theadora Rama Scales, PA-C      PDMP not reviewed this encounter.  Disposition Upon Discharge:  Condition: stable for discharge home Home: take medications as prescribed; routine discharge instructions as discussed; follow up as advised.  Patient presented with an acute illness with associated systemic symptoms and significant discomfort requiring urgent management. In my opinion, this is a condition that a prudent lay person (someone who possesses an average knowledge of health and medicine) may potentially expect to result in complications if not addressed urgently such as respiratory distress, impairment of bodily function or dysfunction of bodily organs.   Routine symptom specific, illness specific and/or disease specific instructions were discussed with the patient and/or caregiver at length.   As such, the patient has been evaluated and assessed, work-up was performed and treatment was provided in alignment with urgent care protocols and evidence based medicine.  Patient/parent/caregiver has been advised that the patient may require follow up for further testing and treatment if the symptoms continue in spite of treatment, as clinically indicated and appropriate.  If the patient was tested for COVID-19, Influenza and/or RSV, then the patient/parent/guardian was advised to isolate at home pending the results of his/her diagnostic coronavirus test and potentially longer if they're  positive. I have  also advised pt that if his/her COVID-19 test returns positive, it's recommended to self-isolate for at least 10 days after symptoms first appeared AND until fever-free for 24 hours without fever reducer AND other symptoms have improved or resolved. Discussed self-isolation recommendations as well as instructions for household member/close contacts as per the [facility] and [place] DHHS, and also gave patient the COVID packet with this information.  Patient/parent/caregiver has been advised to return to the [facility] or PCP in 3-5 days if no better; to PCP or the Emergency Department if new signs and symptoms develop, or if the current signs or symptoms continue to change or worsen for further workup, evaluation and treatment as clinically indicated and appropriate  The patient will follow up with their current PCP if and as advised. If the patient does not currently have a PCP we will assist them in obtaining one.   The patient may need specialty follow up if the symptoms continue, in spite of conservative treatment and management, for further workup, evaluation, consultation and treatment as clinically indicated and appropriate.  Patient/parent/caregiver verbalized understanding and agreement of plan as discussed.  All questions were addressed during visit.  Please see discharge instructions below for further details of plan.  Discharge Instructions:   Discharge Instructions      Your symptoms and physical exam findings are concerning for a viral respiratory infection.  Because you are respiratory allergies are not well controlled at this time, you are more susceptible to catching respiratory infections.  Smoking cigarettes also increases your risk for frequent respiratory infections.  Please see the list below for recommended medications, dosages and frequencies to provide relief of your current symptoms:    Omnicef (cefdinir):  1 capsule twice daily for 10 days, you can take it with or without  food.  This antibiotic can cause upset stomach, this will resolve once antibiotics are complete.  You are welcome to use a probiotic, eat yogurt, take Imodium while taking this medication.  Please avoid other systemic medications such as Maalox, Pepto-Bismol or milk of magnesia as they can interfere with your body's ability to absorb the antibiotics.  It is very important that you take antibiotics as prescribed.  If you skip doses or do not complete the full course of antibiotics, you put yourself at significant risk of recurrent infection which can often be worse than your initial infection.   Diflucan (fluconazole): As you may or may not be aware, taking antibiotics can often cause patients to develop a vaginal yeast infection.  For this reason, I have provided you with a prescription for Diflucan.  Please take the first Diflucan tablet on day 3 or 4 of your antibiotic therapy, and take the second Diflucan tablet 3 days later.  You do not need to pick up this prescription or take this medication unless you develop symptoms of vaginal yeast infection including thick, white vaginal discharge and/or vaginal itching.  This prescription has been provided as a Research officer, political party and for your convenience.  Medrol Dosepak (methylprednisolone): This is a steroid that will significantly calm your upper and lower airways, please take one row of tablets daily with your breakfast meal starting tomorrow morning until the prescription is complete.      Zyrtec (cetirizine): This is an excellent second-generation antihistamine that helps to reduce respiratory inflammatory response to environmental allergens.  In some patients, this medication can cause daytime sleepiness so I recommend that you take 1 tablet daily at bedtime.     Atrovent (ipratropium): This is  an excellent nasal decongestant spray that does not cause rebound congestion, please instill 2 sprays into each nare with each use.  Please use the spray up to 4 times daily  as needed.  I have provided you with a prescription for this medication.      ProAir, Ventolin, Proventil (albuterol): This inhaled medication contains a short acting beta agonist bronchodilator.  This medication works on the smooth muscle that opens and constricts of your airways by relaxing the muscle.  The result of relaxation of the smooth muscle is increased air movement and improved work of breathing.  This is a short acting medication that can be used every 4-6 hours as needed for increased work of breathing, shortness of breath, wheezing and excessive coughing.  I have provided you with a prescription.    Promethazine DM: Promethazine is both a nasal decongestant and an antinausea medication that makes most patients feel fairly sleepy.  The DM is dextromethorphan, a cough suppressant found in many over-the-counter cough medications.  Please take 5 mL before bedtime to minimize your cough which will help you sleep better.  I have sent a prescription for this medication to your pharmacy.   Please follow-up within the next 5-7 days either with your primary care provider or urgent care if your symptoms do not resolve.  If you do not have a primary care provider, we will assist you in finding one.   Thank you for visiting urgent care today.  We appreciate the opportunity to participate in your care.       This office note has been dictated using Teaching laboratory technicianDragon speech recognition software.  Unfortunately, this method of dictation can sometimes lead to typographical or grammatical errors.  I apologize for your inconvenience in advance if this occurs.  Please do not hesitate to reach out to me if clarification is needed.      Theadora RamaMorgan, Kristiane Morsch Scales, PA-C 02/27/22 1224

## 2022-07-21 ENCOUNTER — Ambulatory Visit
Admission: EM | Admit: 2022-07-21 | Discharge: 2022-07-21 | Disposition: A | Discharge status: Home / Self Care | Payer: Managed Care, Other (non HMO) | Attending: Emergency Medicine | Admitting: Emergency Medicine

## 2022-07-21 DIAGNOSIS — B354 Tinea corporis: Secondary | ICD-10-CM | POA: Diagnosis not present

## 2022-07-21 MED ORDER — KETOCONAZOLE 2 % EX CREA: 1.0000 | TOPICAL_CREAM | Freq: Every day | CUTANEOUS | 0 refills | Status: DC

## 2022-07-21 NOTE — ED Triage Notes (Signed)
Pt c/o rash to right inner groin.  Home interventions: OTC rash cream/ spray   Started: 3 days ago

## 2022-07-21 NOTE — ED Provider Notes (Signed)
[facility]    CSN: [id number] Arrival date & time: 07/21/22  3267    HISTORY   Chief Complaint  Patient presents with   Rash   HPI Kristen Nolan is a pleasant, 47 y.o. female who presents to urgent care today. Patient complains of a rash on her right inner thigh for the past 3 days.  Patient states she has been applying an over-the-counter rash cream and rash spray with no relief.  The history is provided by the patient.   History reviewed. No pertinent past medical history. There are no problems to display for this patient.  History reviewed. No pertinent surgical history. OB History   No obstetric history on file.    Home Medications    Prior to Admission medications   Medication Sig Start Date End Date Taking? Authorizing Provider  albuterol (VENTOLIN HFA) 108 (90 Base) MCG/ACT inhaler Inhale 2 puffs into the lungs every 6 (six) hours as needed for wheezing or shortness of breath (Cough). 02/27/22   Theadora Rama Scales, PA-C  cetirizine (ZYRTEC ALLERGY) 10 MG tablet Take 1 tablet (10 mg total) by mouth at bedtime. 02/27/22 08/26/22  Theadora Rama Scales, PA-C  fluconazole (DIFLUCAN) 150 MG tablet Take 1 tablet on day 4 of antibiotics.  Take second tablet 3 days later. 02/27/22   Theadora Rama Scales, PA-C  ipratropium (ATROVENT) 0.06 % nasal spray Place 2 sprays into both nostrils 3 (three) times daily. As needed for nasal congestion, runny nose 02/27/22   Theadora Rama Scales, PA-C  methylPREDNISolone (MEDROL DOSEPAK) 4 MG TBPK tablet Take 24 mg on day 1, 20 mg on day 2, 16 mg on day 3, 12 mg on day 4, 8 mg on day 5, 4 mg on day 6.  Take all tablets in each row at once, do not spread tablets out throughout the day. 02/27/22   Theadora Rama Scales, PA-C  promethazine-dextromethorphan (PROMETHAZINE-DM) 6.25-15 MG/5ML syrup Take 5 mLs by mouth 4 (four) times daily as needed for cough. 02/27/22   Theadora Rama Scales, PA-C    Family History History  reviewed. No pertinent family history. Social History Social History   Tobacco Use   Smoking status: Some Days    Types: Cigarettes  Vaping Use   Vaping Use: Never used  Substance Use Topics   Alcohol use: No   Drug use: Never   Allergies   Patient has no known allergies.  Review of Systems Review of Systems Pertinent findings revealed after performing a 14 point review of systems has been noted in the history of present illness.  Physical Exam Triage Vital Signs ED Triage Vitals  Enc Vitals Group     BP 05/10/21 0827 (!) 147/82     Pulse Rate 05/10/21 0827 72     Resp 05/10/21 0827 18     Temp 05/10/21 0827 98.3 F (36.8 C)     Temp Source 05/10/21 0827 Oral     SpO2 05/10/21 0827 98 %     Weight --      Height --      Head Circumference --      Peak Flow --      Pain Score 05/10/21 0826 5     Pain Loc --      Pain Edu? --      Excl. in GC? --   No data found.  Updated Vital Signs BP 120/77 (BP Location: Left Arm)   Pulse 68   Temp 98.9 F (37.2 C) (  Oral)   Resp 16   LMP 07/12/2022 (Approximate)   SpO2 98%   Physical Exam Vitals and nursing note reviewed.  Constitutional:      General: She is not in acute distress.    Appearance: Normal appearance.  HENT:     Head: Normocephalic and atraumatic.  Eyes:     Pupils: Pupils are equal, round, and reactive to light.  Cardiovascular:     Rate and Rhythm: Normal rate and regular rhythm.  Pulmonary:     Effort: Pulmonary effort is normal.     Breath sounds: Normal breath sounds.  Musculoskeletal:        General: Normal range of motion.     Cervical back: Normal range of motion and neck supple.  Skin:    General: Skin is warm and dry.     Findings: Rash (Well-demarcated annular rash at the superior aspect of the right inner thigh with mild erythema and central clearing.) present.  Neurological:     General: No focal deficit present.     Mental Status: She is alert and oriented to person, place, and time.  Mental status is at baseline.  Psychiatric:        Mood and Affect: Mood normal.        Behavior: Behavior normal.        Thought Content: Thought content normal.        Judgment: Judgment normal.    Visual Acuity Right Eye Distance:   Left Eye Distance:   Bilateral Distance:    Right Eye Near:   Left Eye Near:    Bilateral Near:     UC Couse / Diagnostics / Procedures:     Radiology No results found.  Procedures Procedures (including critical care time) EKG  Pending results:  Labs Reviewed - No data to display  Medications Ordered in UC: Medications - No data to display  UC Diagnoses / Final Clinical Impressions(s)   I have reviewed the triage vital signs and the nursing notes.  Pertinent labs & imaging results that were available during my care of the patient were reviewed by me and considered in my medical decision making (see chart for details).    Final diagnoses:  Tinea corporis   Patient provided with ketoconazole cream to use daily until rash is resolved.  Return precautions advised.  Please see discharge instructions below for details of plan of care as provided to patient. ED Prescriptions     Medication Sig Dispense Auth. Provider   ketoconazole (NIZORAL) 2 % cream Apply 1 Application topically daily. 60 g Lynden Oxford Scales, PA-C      PDMP not reviewed this encounter.  Pending results:  Labs Reviewed - No data to display  Discharge Instructions:   Discharge Instructions      Apply ketoconazole cream generously to the affected area and 1 inch surrounding every day until 7 days after the rash has resolved.  In 3 to 5 days, if you see no meaningful improvement of the rash, please follow-up with either your primary care provider or with a dermatologist for further evaluation and treatment.  Thank you for visiting urgent care today.      Disposition Upon Discharge:  Condition: stable for discharge home  Patient presented with an  acute illness with associated systemic symptoms and significant discomfort requiring urgent management. In my opinion, this is a condition that a prudent lay person (someone who possesses an average knowledge of health and medicine) may potentially expect to result  in complications if not addressed urgently such as respiratory distress, impairment of bodily function or dysfunction of bodily organs.   Routine symptom specific, illness specific and/or disease specific instructions were discussed with the patient and/or caregiver at length.   As such, the patient has been evaluated and assessed, work-up was performed and treatment was provided in alignment with urgent care protocols and evidence based medicine.  Patient/parent/caregiver has been advised that the patient may require follow up for further testing and treatment if the symptoms continue in spite of treatment, as clinically indicated and appropriate.  Patient/parent/caregiver has been advised to return to the [facility] or PCP if no better; to PCP or the Emergency Department if new signs and symptoms develop, or if the current signs or symptoms continue to change or worsen for further workup, evaluation and treatment as clinically indicated and appropriate  The patient will follow up with their current PCP if and as advised. If the patient does not currently have a PCP we will assist them in obtaining one.   The patient may need specialty follow up if the symptoms continue, in spite of conservative treatment and management, for further workup, evaluation, consultation and treatment as clinically indicated and appropriate.  Patient/parent/caregiver verbalized understanding and agreement of plan as discussed.  All questions were addressed during visit.  Please see discharge instructions below for further details of plan.  This office note has been dictated using Museum/gallery curator.  Unfortunately, this method of dictation can sometimes  lead to typographical or grammatical errors.  I apologize for your inconvenience in advance if this occurs.  Please do not hesitate to reach out to me if clarification is needed.      Lynden Oxford Scales, PA-C 07/21/22 1011

## 2022-07-21 NOTE — Discharge Instructions (Addendum)
Apply ketoconazole cream generously to the affected area and 1 inch surrounding every day until 7 days after the rash has resolved.  In 3 to 5 days, if you see no meaningful improvement of the rash, please follow-up with either your primary care provider or with a dermatologist for further evaluation and treatment.  Thank you for visiting urgent care today.

## 2022-11-20 ENCOUNTER — Ambulatory Visit
Admission: EM | Admit: 2022-11-20 | Discharge: 2022-11-20 | Disposition: A | Discharge status: Home / Self Care | Payer: Managed Care, Other (non HMO) | Attending: Nurse Practitioner | Admitting: Nurse Practitioner

## 2022-11-20 DIAGNOSIS — B9689 Other specified bacterial agents as the cause of diseases classified elsewhere: Secondary | ICD-10-CM

## 2022-11-20 DIAGNOSIS — J209 Acute bronchitis, unspecified: Secondary | ICD-10-CM

## 2022-11-20 DIAGNOSIS — J329 Chronic sinusitis, unspecified: Secondary | ICD-10-CM | POA: Diagnosis not present

## 2022-11-20 DIAGNOSIS — H6993 Unspecified Eustachian tube disorder, bilateral: Secondary | ICD-10-CM

## 2022-11-20 MED ORDER — FLUTICASONE PROPIONATE 50 MCG/ACT NA SUSP: 1.0000 | Freq: Every day | NASAL | 0 refills | Status: DC

## 2022-11-20 MED ORDER — PROMETHAZINE-DM 6.25-15 MG/5ML PO SYRP: 5.0000 mL | ORAL_SOLUTION | Freq: Four times a day (QID) | ORAL | 0 refills | Status: DC | PRN

## 2022-11-20 MED ORDER — AMOXICILLIN-POT CLAVULANATE 875-125 MG PO TABS: 1.0000 | ORAL_TABLET | Freq: Two times a day (BID) | ORAL | 0 refills | Status: DC

## 2022-11-20 MED ORDER — FLUCONAZOLE 150 MG PO TABS: 150.0000 mg | ORAL_TABLET | Freq: Every day | ORAL | 0 refills | Status: DC

## 2022-11-20 NOTE — ED Provider Notes (Signed)
[facility]    CSN: [id number] Arrival date & time: 11/20/22  1452      History   Chief Complaint No chief complaint on file.   HPI Kristen Nolan is a 47 y.o. female  presents for evaluation of URI symptoms for 7 days. Patient reports associated symptoms of cough, congestion, ear pain, sinus pressure and pain. Denies N/V/D, fevers, shortness of breath, body aches, sore throat. Patient does not have a hx of asthma.  She is a current smoker.  No known sick contacts.  Pt has taken Mucinex and Delsym OTC for symptoms. Pt has no other concerns at this time.   HPI  History reviewed. No pertinent past medical history.  There are no problems to display for this patient.   History reviewed. No pertinent surgical history.  OB History   No obstetric history on file.      Home Medications    Prior to Admission medications   Medication Sig Start Date End Date Taking? Authorizing Provider  amoxicillin-clavulanate (AUGMENTIN) 875-125 MG tablet Take 1 tablet by mouth every 12 (twelve) hours. 11/20/22  Yes Radford Pax, NP  fluconazole (DIFLUCAN) 150 MG tablet Take 1 tablet (150 mg total) by mouth daily. 11/20/22  Yes Radford Pax, NP  fluticasone (FLONASE) 50 MCG/ACT nasal spray Place 1 spray into both nostrils daily. 11/20/22  Yes Radford Pax, NP  promethazine-dextromethorphan (PROMETHAZINE-DM) 6.25-15 MG/5ML syrup Take 5 mLs by mouth 4 (four) times daily as needed for cough. 11/20/22  Yes Radford Pax, NP  albuterol (VENTOLIN HFA) 108 (90 Base) MCG/ACT inhaler Inhale 2 puffs into the lungs every 6 (six) hours as needed for wheezing or shortness of breath (Cough). 02/27/22   Theadora Rama Scales, PA-C  cetirizine (ZYRTEC ALLERGY) 10 MG tablet Take 1 tablet (10 mg total) by mouth at bedtime. 02/27/22 08/26/22  Theadora Rama Scales, PA-C  ketoconazole (NIZORAL) 2 % cream Apply 1 Application topically daily. 07/21/22   Theadora Rama Scales, PA-C    Family History History  reviewed. No pertinent family history.  Social History Social History   Tobacco Use   Smoking status: Some Days    Types: Cigarettes  Vaping Use   Vaping Use: Never used  Substance Use Topics   Alcohol use: No   Drug use: Never     Allergies   Patient has no known allergies.   Review of Systems Review of Systems  HENT:  Positive for congestion, ear pain, sinus pressure and sinus pain.   Respiratory:  Positive for cough.      Physical Exam Triage Vital Signs ED Triage Vitals  Enc Vitals Group     BP 11/20/22 1541 121/84     Pulse Rate 11/20/22 1541 71     Resp 11/20/22 1541 17     Temp 11/20/22 1541 98.5 F (36.9 C)     Temp Source 11/20/22 1541 Oral     SpO2 11/20/22 1541 90 %     Weight --      Height --      Head Circumference --      Peak Flow --      Pain Score 11/20/22 1540 2     Pain Loc --      Pain Edu? --      Excl. in GC? --    No data found.  Updated Vital Signs BP 121/84 (BP Location: Left Arm)   Pulse 71   Temp 98.5 F (36.9 C) (Oral)  Resp 17   LMP 11/13/2022   SpO2 95%   Visual Acuity Right Eye Distance:   Left Eye Distance:   Bilateral Distance:    Right Eye Near:   Left Eye Near:    Bilateral Near:     Physical Exam Vitals and nursing note reviewed.  Constitutional:      General: She is not in acute distress.    Appearance: She is well-developed. She is not ill-appearing.  HENT:     Head: Normocephalic and atraumatic.     Right Ear: Ear canal normal. A middle ear effusion is present. Tympanic membrane is not erythematous.     Left Ear: Ear canal normal. A middle ear effusion is present. Tympanic membrane is not erythematous.     Nose: Congestion present.     Right Turbinates: Swollen and pale.     Left Turbinates: Swollen and pale.     Right Sinus: Maxillary sinus tenderness present. No frontal sinus tenderness.     Left Sinus: Maxillary sinus tenderness present. No frontal sinus tenderness.     Mouth/Throat:      Mouth: Mucous membranes are moist.     Pharynx: Oropharynx is clear. Uvula midline. Posterior oropharyngeal erythema present.     Tonsils: No tonsillar exudate or tonsillar abscesses.  Eyes:     Conjunctiva/sclera: Conjunctivae normal.     Pupils: Pupils are equal, round, and reactive to light.  Cardiovascular:     Rate and Rhythm: Normal rate and regular rhythm.     Heart sounds: Normal heart sounds.  Pulmonary:     Effort: Pulmonary effort is normal.     Breath sounds: Normal breath sounds.  Musculoskeletal:     Cervical back: Normal range of motion and neck supple.  Lymphadenopathy:     Cervical: No cervical adenopathy.  Skin:    General: Skin is warm and dry.  Neurological:     General: No focal deficit present.     Mental Status: She is alert and oriented to person, place, and time.  Psychiatric:        Mood and Affect: Mood normal.        Behavior: Behavior normal.      UC Treatments / Results  Labs (all labs ordered are listed, but only abnormal results are displayed) Labs Reviewed - No data to display  EKG   Radiology No results found.  Procedures Procedures (including critical care time)  Medications Ordered in UC Medications - No data to display  Initial Impression / Assessment and Plan / UC Course  I have reviewed the triage vital signs and the nursing notes.  Pertinent labs & imaging results that were available during my care of the patient were reviewed by me and considered in my medical decision making (see chart for details).     Reviewed exam and symptoms with patient Start Augmentin daily and Flonase daily Promethazine DM as needed for cough.  Side effect profile reviewed Diflucan as patient reports antibiotic induced yeast infections PCP follow-up if symptoms do not improve ER precautions reviewed and patient verbalized understanding Final Clinical Impressions(s) / UC Diagnoses   Final diagnoses:  Bacterial sinusitis  Eustachian tube  dysfunction, bilateral  Acute bronchitis, unspecified organism     Discharge Instructions      Start Augmentin twice daily for 7 days.  You may take Diflucan on day 3 of antibiotics to help prevent yeast infections Flonase daily Promethazine DM as needed for cough Please follow-up with your PCP if  your symptoms do not improve Please go to the emergency room if you develop any worsening symptoms   ED Prescriptions     Medication Sig Dispense Auth. Provider   amoxicillin-clavulanate (AUGMENTIN) 875-125 MG tablet Take 1 tablet by mouth every 12 (twelve) hours. 14 tablet Radford Pax, NP   fluticasone (FLONASE) 50 MCG/ACT nasal spray Place 1 spray into both nostrils daily. 15.8 mL Radford Pax, NP   promethazine-dextromethorphan (PROMETHAZINE-DM) 6.25-15 MG/5ML syrup Take 5 mLs by mouth 4 (four) times daily as needed for cough. 118 mL Radford Pax, NP   fluconazole (DIFLUCAN) 150 MG tablet Take 1 tablet (150 mg total) by mouth daily. 1 tablet Radford Pax, NP      PDMP not reviewed this encounter.   Radford Pax, NP 11/20/22 [phone]

## 2022-11-20 NOTE — Discharge Instructions (Signed)
Start Augmentin twice daily for 7 days.  You may take Diflucan on day 3 of antibiotics to help prevent yeast infections Flonase daily Promethazine DM as needed for cough Please follow-up with your PCP if your symptoms do not improve Please go to the emergency room if you develop any worsening symptoms

## 2022-11-20 NOTE — ED Triage Notes (Signed)
Pt presents with c/o possible sinus infection. States she has had runny nose, states the drainage is going down to her ear and chest x 1 week. Pt states her cough has worsened.   Home interventions: mucinex, delsym

## 2023-06-16 ENCOUNTER — Ambulatory Visit
Admission: EM | Admit: 2023-06-16 | Discharge: 2023-06-16 | Disposition: A | Discharge status: Home / Self Care | Payer: Managed Care, Other (non HMO) | Attending: Family Medicine | Admitting: Family Medicine

## 2023-06-16 DIAGNOSIS — F17200 Nicotine dependence, unspecified, uncomplicated: Secondary | ICD-10-CM

## 2023-06-16 DIAGNOSIS — J209 Acute bronchitis, unspecified: Secondary | ICD-10-CM | POA: Diagnosis not present

## 2023-06-16 MED ORDER — ALBUTEROL SULFATE HFA 108 (90 BASE) MCG/ACT IN AERS: 1.0000 | INHALATION_SPRAY | Freq: Four times a day (QID) | RESPIRATORY_TRACT | 0 refills | Status: AC | PRN

## 2023-06-16 MED ORDER — PROMETHAZINE-DM 6.25-15 MG/5ML PO SYRP: 5.0000 mL | ORAL_SOLUTION | Freq: Three times a day (TID) | ORAL | 0 refills | Status: AC | PRN

## 2023-06-16 MED ORDER — PREDNISONE 20 MG PO TABS: ORAL_TABLET | ORAL | 0 refills | Status: AC

## 2023-06-16 NOTE — ED Triage Notes (Signed)
Pt c/o dry/prod cough, nasal congestion, HA x 3 days-denies fever-taking mucinex with some relief-NAD-steady gait

## 2023-06-16 NOTE — ED Provider Notes (Signed)
[facility]  Note:  This document was prepared using Conservation officer, historic buildings and may include unintentional dictation errors.  MRN: [id number] DOB: 1976-03-29  Subjective:   Kristen Nolan is a 47 y.o. female presenting for 3 day history of intermittently productive cough, sinus congestion, wheezing, sinus headaches. Smokes 1ppd. No history of asthma, COPD. Has previously had to use an inhaler. Underwent a course of Augmentin 11/2022.   No current facility-administered medications for this encounter. No current outpatient medications on file.   No Known Allergies  History reviewed. No pertinent past medical history.   History reviewed. No pertinent surgical history.  No family history on file.  Social History   Tobacco Use   Smoking status: Every Day    Types: Cigarettes  Vaping Use   Vaping status: Never Used  Substance Use Topics   Alcohol use: No   Drug use: Never    ROS   Objective:   Vitals: BP 120/76 (BP Location: Right Arm)   Pulse 61   Temp 98.2 F (36.8 C) (Oral)   Resp 20   LMP 06/15/2023   SpO2 96%   Physical Exam Constitutional:      General: She is not in acute distress.    Appearance: Normal appearance. She is well-developed and normal weight. She is not ill-appearing, toxic-appearing or diaphoretic.  HENT:     Head: Normocephalic and atraumatic.     Right Ear: Tympanic membrane, ear canal and external ear normal. No drainage or tenderness. No middle ear effusion. There is no impacted cerumen. Tympanic membrane is not erythematous or bulging.     Left Ear: Tympanic membrane, ear canal and external ear normal. No drainage or tenderness.  No middle ear effusion. There is no impacted cerumen. Tympanic membrane is not erythematous or bulging.     Nose: Nose normal. No congestion or rhinorrhea.     Mouth/Throat:     Mouth: Mucous membranes are moist. No oral lesions.     Pharynx: No pharyngeal swelling,  oropharyngeal exudate, posterior oropharyngeal erythema or uvula swelling.     Tonsils: No tonsillar exudate or tonsillar abscesses.  Eyes:     General: No scleral icterus.       Right eye: No discharge.        Left eye: No discharge.     Extraocular Movements: Extraocular movements intact.     Right eye: Normal extraocular motion.     Left eye: Normal extraocular motion.     Conjunctiva/sclera: Conjunctivae normal.  Cardiovascular:     Rate and Rhythm: Normal rate and regular rhythm.     Heart sounds: Normal heart sounds. No murmur heard.    No friction rub. No gallop.  Pulmonary:     Effort: Pulmonary effort is normal. No respiratory distress.     Breath sounds: No stridor. No wheezing, rhonchi or rales.  Chest:     Chest wall: No tenderness.  Musculoskeletal:     Cervical back: Normal range of motion and neck supple.  Lymphadenopathy:     Cervical: No cervical adenopathy.  Skin:    General: Skin is warm and dry.  Neurological:     General: No focal deficit present.     Mental Status: She is alert and oriented to person, place, and time.  Psychiatric:        Mood and Affect: Mood normal.        Behavior: Behavior normal.     Assessment and Plan :  PDMP not reviewed this encounter.  1. Acute bronchitis, unspecified organism   2. Smoker     Will manage for bronchitis with prednisone, albuterol and general supportive care. Deferred imaging for now. Counseled patient on potential for adverse effects with medications prescribed/recommended today, ER and return-to-clinic precautions discussed, patient verbalized understanding.    Wallis Bamberg, [place] 06/16/23 1610
